# Patient Record
Sex: Female | Born: 1975 | Race: White | Hispanic: No | Smoking: Never smoker
Health system: Southern US, Community
[De-identification: ages and names within clinical notes are randomized; demographics above are authoritative.]

## PROBLEM LIST (undated history)

## (undated) DIAGNOSIS — K5090 Crohn's disease, unspecified, without complications: Secondary | ICD-10-CM

## (undated) DIAGNOSIS — F4310 Post-traumatic stress disorder, unspecified: Secondary | ICD-10-CM

## (undated) DIAGNOSIS — F329 Major depressive disorder, single episode, unspecified: Secondary | ICD-10-CM

## (undated) DIAGNOSIS — F32A Depression, unspecified: Secondary | ICD-10-CM

## (undated) DIAGNOSIS — G43909 Migraine, unspecified, not intractable, without status migrainosus: Secondary | ICD-10-CM

## (undated) DIAGNOSIS — F319 Bipolar disorder, unspecified: Secondary | ICD-10-CM

## (undated) DIAGNOSIS — F419 Anxiety disorder, unspecified: Secondary | ICD-10-CM

---

## 2018-05-19 ENCOUNTER — Emergency Department: Payer: Medicare HMO

## 2018-05-19 ENCOUNTER — Emergency Department
Admission: EM | Admit: 2018-05-19 | Discharge: 2018-05-19 | Disposition: A | Discharge status: Home / Self Care | Payer: Medicare HMO | Attending: Emergency Medicine | Admitting: Emergency Medicine

## 2018-05-19 ENCOUNTER — Other Ambulatory Visit: Payer: Self-pay

## 2018-05-19 DIAGNOSIS — R55 Syncope and collapse: Secondary | ICD-10-CM | POA: Insufficient documentation

## 2018-05-19 DIAGNOSIS — R0789 Other chest pain: Secondary | ICD-10-CM | POA: Diagnosis not present

## 2018-05-19 HISTORY — DX: Anxiety disorder, unspecified: F41.9

## 2018-05-19 HISTORY — DX: Depression, unspecified: F32.A

## 2018-05-19 HISTORY — DX: Bipolar disorder, unspecified: F31.9

## 2018-05-19 HISTORY — DX: Migraine, unspecified, not intractable, without status migrainosus: G43.909

## 2018-05-19 HISTORY — DX: Post-traumatic stress disorder, unspecified: F43.10

## 2018-05-19 HISTORY — DX: Crohn's disease, unspecified, without complications: K50.90

## 2018-05-19 HISTORY — DX: Major depressive disorder, single episode, unspecified: F32.9

## 2018-05-19 LAB — HEPATIC FUNCTION PANEL
ALT: 10 U/L (ref 0–44)
AST: 14 U/L — ABNORMAL LOW (ref 15–41)
Albumin: 3.5 g/dL (ref 3.5–5.0)
Alkaline Phosphatase: 48 U/L (ref 38–126)
BILIRUBIN TOTAL: 1.3 mg/dL — AB (ref 0.3–1.2)
Bilirubin, Direct: 0.2 mg/dL (ref 0.0–0.2)
Indirect Bilirubin: 1.1 mg/dL — ABNORMAL HIGH (ref 0.3–0.9)
Total Protein: 6.3 g/dL — ABNORMAL LOW (ref 6.5–8.1)

## 2018-05-19 LAB — CBC WITH DIFFERENTIAL/PLATELET
Abs Immature Granulocytes: 0.05 10*3/uL (ref 0.00–0.07)
BASOS PCT: 0 %
Basophils Absolute: 0 10*3/uL (ref 0.0–0.1)
Eosinophils Absolute: 0 10*3/uL (ref 0.0–0.5)
Eosinophils Relative: 0 %
HCT: 45.1 % (ref 36.0–46.0)
Hemoglobin: 15.2 g/dL — ABNORMAL HIGH (ref 12.0–15.0)
Immature Granulocytes: 0 %
Lymphocytes Relative: 6 %
Lymphs Abs: 0.6 10*3/uL — ABNORMAL LOW (ref 0.7–4.0)
MCH: 32.2 pg (ref 26.0–34.0)
MCHC: 33.7 g/dL (ref 30.0–36.0)
MCV: 95.6 fL (ref 80.0–100.0)
Monocytes Absolute: 0.8 10*3/uL (ref 0.1–1.0)
Monocytes Relative: 7 %
NEUTROS PCT: 87 %
Neutro Abs: 9.9 10*3/uL — ABNORMAL HIGH (ref 1.7–7.7)
Platelets: 303 10*3/uL (ref 150–400)
RBC: 4.72 MIL/uL (ref 3.87–5.11)
RDW: 12.9 % (ref 11.5–15.5)
WBC: 11.4 10*3/uL — ABNORMAL HIGH (ref 4.0–10.5)
nRBC: 0 % (ref 0.0–0.2)

## 2018-05-19 LAB — BASIC METABOLIC PANEL
ANION GAP: 4 — AB (ref 5–15)
BUN: 12 mg/dL (ref 6–20)
CO2: 22 mmol/L (ref 22–32)
Calcium: 8.4 mg/dL — ABNORMAL LOW (ref 8.9–10.3)
Chloride: 111 mmol/L (ref 98–111)
Creatinine, Ser: 0.9 mg/dL (ref 0.44–1.00)
GFR calc Af Amer: >60 mL/min (ref >60)
GFR calc non Af Amer: >60 mL/min (ref >60)
Glucose, Bld: 120 mg/dL — ABNORMAL HIGH (ref 70–99)
Potassium: 3.9 mmol/L (ref 3.5–5.1)
Sodium: 137 mmol/L (ref 135–145)

## 2018-05-19 LAB — TROPONIN I

## 2018-05-19 LAB — LIPASE, BLOOD: Lipase: 34 U/L (ref 11–51)

## 2018-05-19 MED ORDER — MORPHINE SULFATE (PF) 4 MG/ML IV SOLN
4.0000 mg | Freq: Once | INTRAVENOUS | Status: AC
  Administered 2018-05-19: 4 mg via INTRAVENOUS
  Filled 2018-05-19: qty 1

## 2018-05-19 MED ORDER — ONDANSETRON HCL 4 MG/2ML IJ SOLN: 4.0000 mg | Freq: Once | INTRAMUSCULAR | Status: DC

## 2018-05-19 MED ORDER — ACETAMINOPHEN 500 MG PO TABS
1000.0000 mg | ORAL_TABLET | Freq: Once | ORAL | Status: AC
  Administered 2018-05-19: 1000 mg via ORAL
  Filled 2018-05-19: qty 2

## 2018-05-19 MED ORDER — FAMOTIDINE IN NACL 20-0.9 MG/50ML-% IV SOLN
20.0000 mg | Freq: Once | INTRAVENOUS | Status: AC
  Administered 2018-05-19: 20 mg via INTRAVENOUS
  Filled 2018-05-19: qty 50

## 2018-05-19 MED ORDER — SODIUM CHLORIDE 0.9 % IV BOLUS
1000.0000 mL | Freq: Once | INTRAVENOUS | Status: AC
  Administered 2018-05-19: 1000 mL via INTRAVENOUS

## 2018-05-19 MED ORDER — LIDOCAINE VISCOUS HCL 2 % MT SOLN
15.0000 mL | Freq: Once | OROMUCOSAL | Status: AC
  Administered 2018-05-19: 15 mL via OROMUCOSAL
  Filled 2018-05-19: qty 15

## 2018-05-19 NOTE — Discharge Instructions (Addendum)
Make sure to drink plenty of fluids and eat regularly throughout the day over the next several days.  Return to the ER for new, worsening, or persistent chest pain, vomiting, weakness, recurrent passing out or feeling like you are about to pass out, or any other new or worsening symptoms that concern you.

## 2018-05-19 NOTE — ED Notes (Signed)
Pt had 324 aspirin enroute

## 2018-05-19 NOTE — ED Notes (Signed)
Pt given crackers and a soda per MD.

## 2018-05-19 NOTE — ED Provider Notes (Signed)
[facility] Emergency Department Provider Note ____________________________________________   First MD Initiated Contact with Patient 05/19/18 1809     (approximate)  I have reviewed the triage vital signs and the nursing notes.   HISTORY  Chief Complaint Near Syncope and Abdominal Pain    HPI Melissa Frost is a 43 y.o. female with PMH as noted below who presents with syncope x2 this evening, acute onset, and preceded by prodrome of lightheadedness.  The patient states that earlier today she donated plasma.  Subsequently she was eating and felt some lower chest discomfort which she has had previously when she has esophageal spasm.  The patient was prescribed nitroglycerin for this.  She took 1, and then began to feel lightheaded.  After she got up she syncopized and states that her head hit the ground.  A short time after the patient got up and syncopized the second time.  She now continues to feel somewhat lightheaded and has a headache.  She reports persistent lower chest discomfort but no acute pain.  She denies shortness of breath or vomiting.  Past Medical History:  Diagnosis Date  . Anxiety   . Bipolar 1 disorder (HCC)   . Crohn disease (HCC)   . Depression   . Migraine   . PTSD (post-traumatic stress disorder)     There are no active problems to display for this patient.   History reviewed. No pertinent surgical history.  Prior to Admission medications   Not on File    Allergies Amoxicillin and Percocet [oxycodone-acetaminophen]  No family history on file.  Social History Social History   Tobacco Use  . Smoking status: Never Smoker  . Smokeless tobacco: Never Used  Substance Use Topics  . Alcohol use: Never    Frequency: Never  . Drug use: Never    Review of Systems  Constitutional: No fever. Eyes: No redness. ENT: No sore throat. Cardiovascular: Denies chest pain. Respiratory: Denies shortness of  breath. Gastrointestinal: No vomiting.  Genitourinary: Negative for dysuria.  Musculoskeletal: Negative for back pain. Skin: Negative for rash. Neurological: Positive for headache.   ____________________________________________   PHYSICAL EXAM:  VITAL SIGNS: ED Triage Vitals [05/19/18 1813]  Enc Vitals Group     BP      Pulse      Resp      Temp      Temp src      SpO2      Weight 254 lb (115.2 kg)     Height 5\' 4"  (1.626 m)     Head Circumference      Peak Flow      Pain Score 6     Pain Loc      Pain Edu?      Excl. in GC?     Constitutional: Alert and oriented.  Relatively well appearing and in no acute distress. Eyes: Conjunctivae are normal.  EOMI.  PERRLA. Head: Atraumatic. Nose: No congestion/rhinnorhea. Mouth/Throat: Mucous membranes are slightly dry.   Neck: Normal range of motion.  Cardiovascular: Normal rate, regular rhythm. Grossly normal heart sounds.  Good peripheral circulation. Respiratory: Normal respiratory effort.  No retractions. Lungs CTAB. Gastrointestinal: Soft and nontender. No distention.  Genitourinary: No flank tenderness. Musculoskeletal: No lower extremity edema.  Extremities warm and well perfused.  Neurologic:  Normal speech and language. No gross focal neurologic deficits are appreciated.  Skin:  Skin is warm and dry. No rash noted. Psychiatric: Mood and affect are normal. Speech and behavior are  normal.  ____________________________________________   LABS (all labs ordered are listed, but only abnormal results are displayed)  Labs Reviewed  BASIC METABOLIC PANEL - Abnormal; Notable for the following components:      Result Value   Glucose, Bld 120 (*)    Calcium 8.4 (*)    Anion gap 4 (*)    All other components within normal limits  CBC WITH DIFFERENTIAL/PLATELET - Abnormal; Notable for the following components:   WBC 11.4 (*)    Hemoglobin 15.2 (*)    Neutro Abs 9.9 (*)    Lymphs Abs 0.6 (*)    All other components  within normal limits  HEPATIC FUNCTION PANEL - Abnormal; Notable for the following components:   Total Protein 6.3 (*)    AST 14 (*)    Total Bilirubin 1.3 (*)    Indirect Bilirubin 1.1 (*)    All other components within normal limits  TROPONIN I  LIPASE, BLOOD  URINALYSIS, COMPLETE (UACMP) WITH MICROSCOPIC   ____________________________________________  EKG  ED ECG REPORT I, Dionne Bucy, the attending physician, personally viewed and interpreted this ECG.  Date: 05/19/2018 EKG Time: 1806 Rate: 91 Rhythm: normal sinus rhythm QRS Axis: normal Intervals: normal ST/T Wave abnormalities: normal Narrative Interpretation: no evidence of acute ischemia  ____________________________________________  RADIOLOGY  CXR: No focal infiltrate or other acute abnormality  ____________________________________________   PROCEDURES  Procedure(s) performed: No  Procedures  Critical Care performed: No ____________________________________________   INITIAL IMPRESSION / ASSESSMENT AND PLAN / ED COURSE  Pertinent labs & imaging results that were available during my care of the patient were reviewed by me and considered in my medical decision making (see chart for details).  43 year old female with PMH as noted above including prior history of syncope as well as esophageal spasm presents with syncope x2 at home.  Previously the patient donated plasma today, and subsequently after eating began to have lower chest discomfort identical to prior esophageal spasm.  She took a nitroglycerin, and then developed lightheadedness and eventually passed out.  On exam currently the patient is relatively well-appearing.  Her vital signs are normal.  Mucous membranes are slightly dry.  The abdomen is soft and nontender.  The remainder of the exam is as described above.  Overall I suspect most likely hypovolemia and hypotension related to the plasma donation and nitroglycerin.  EKG is nonischemic  and I do not suspect cardiac etiology.  We will give fluids, obtain basic, hepatobiliary, and cardiac labs, and reassess.  ----------------------------------------- 11:45 PM on 05/19/2018 -----------------------------------------  The patient was feeling better after fluids although she started to have a recurrence of the chest discomfort she attributes to esophageal spasm.  We gave a dose of Pepcid and viscous lidocaine here and the symptoms improved.  The patient felt better and wanted to go home.  I discussed the results of the work-up with her.  Return precautions given, and she expressed understanding. ____________________________________________   FINAL CLINICAL IMPRESSION(S) / ED DIAGNOSES  Final diagnoses:  Syncope, unspecified syncope type  Atypical chest pain      NEW MEDICATIONS STARTED DURING THIS VISIT:  There are no discharge medications for this patient.    Note:  This document was prepared using Dragon voice recognition software and may include unintentional dictation errors.    Dionne Bucy, MD 05/19/18 [phone]

## 2018-05-19 NOTE — ED Triage Notes (Signed)
Pt to ED from home via [facility]. Pt donated plasma this morning, went home and ate. Had chest pain (pt has history of esophogeal spasms-states it feels like that) Pt took a sublingual nitro and had syncopal episode. PT felt nauseas and attempted to get to kitchen to vomit and lost consciousness again and hit head this time. Pt not on  Blood thinners. NAD at this time.

## 2020-07-09 IMAGING — DX DG CHEST 1V PORT
1 series · 1 of 1 positions shown · non-contrast
Comparison: None.

CLINICAL DATA: Chest pain

EXAM:
PORTABLE CHEST 1 VIEW

[chest ap]
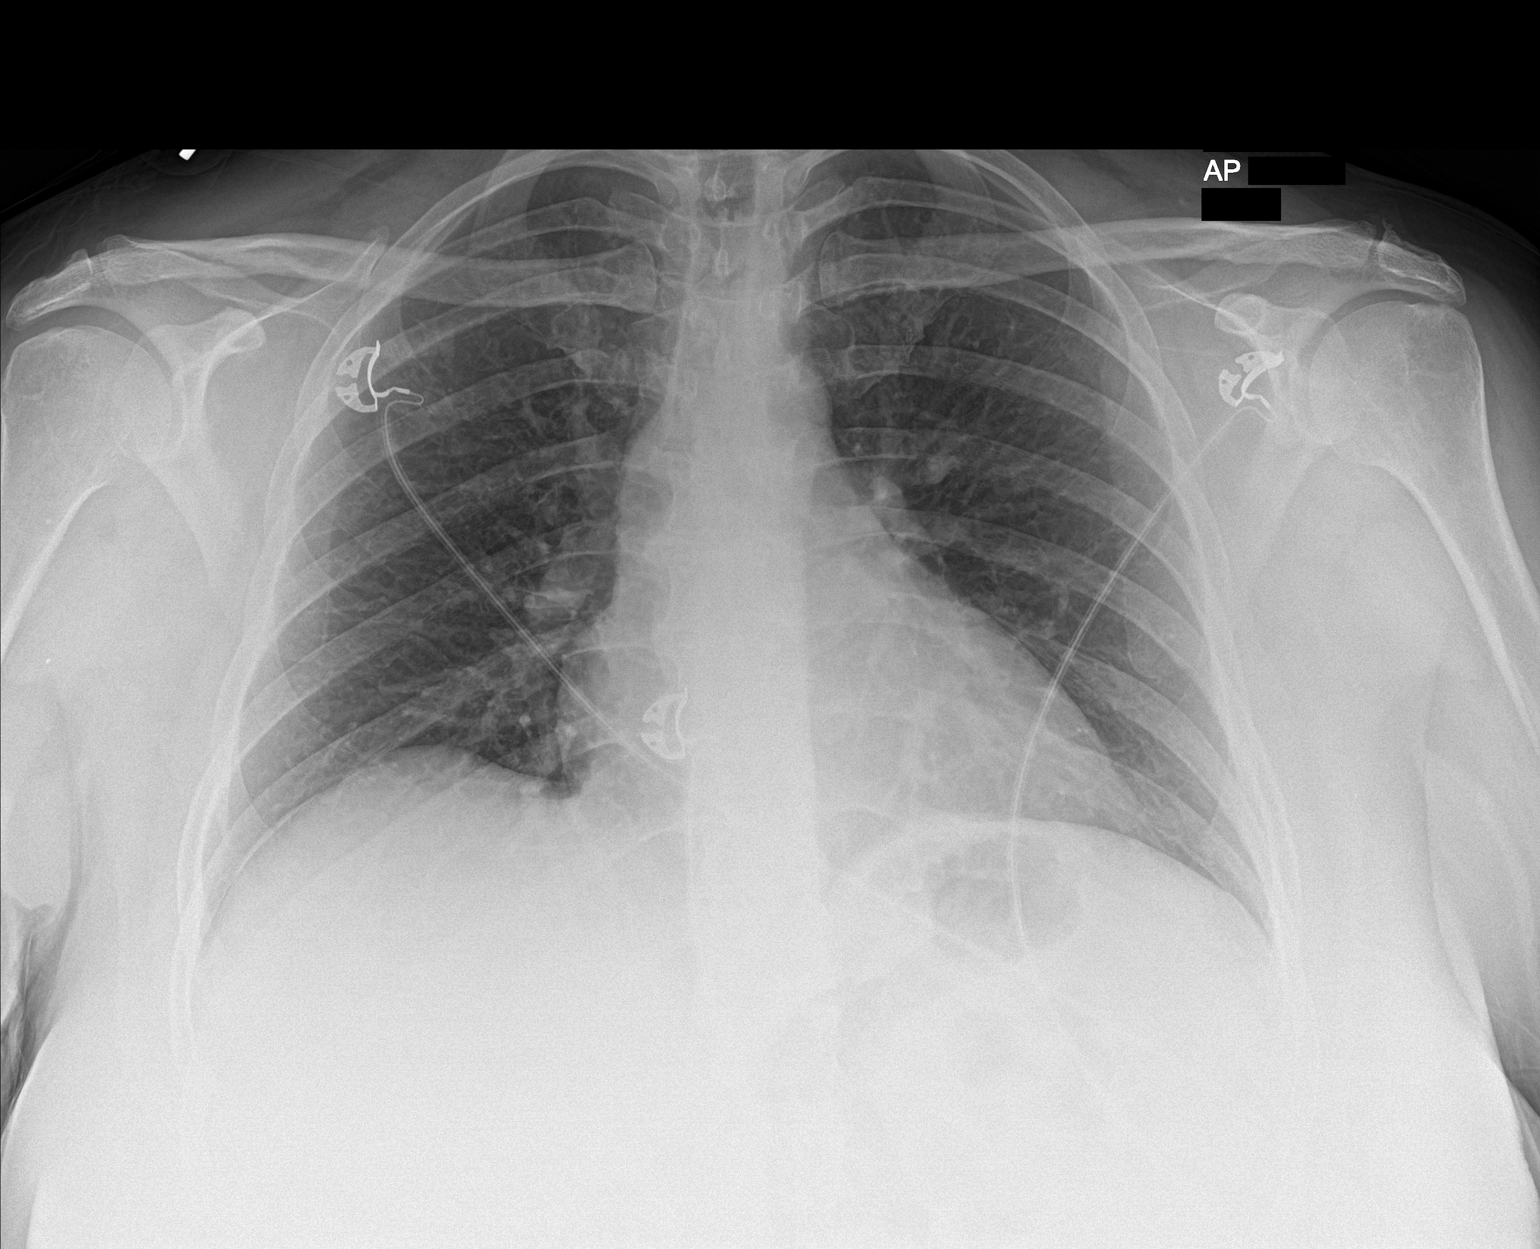

[1 of 1 positions shown; findings below may reference images not displayed]

FINDINGS: Normal heart size. Normal mediastinal contour. No pneumothorax. No
pleural effusion. Lungs appear clear, with no acute consolidative
airspace disease and no pulmonary edema.
IMPRESSION: No active disease.
